# Patient Record
Sex: Male | Born: 1970 | Race: White | Hispanic: No | Marital: Single | Smoking: Current every day smoker
Health system: Southern US, Community
[De-identification: ages and names within clinical notes are randomized; demographics above are authoritative.]

## PROBLEM LIST (undated history)

## (undated) HISTORY — PX: HERNIA REPAIR: SHX51

---

## 2013-11-30 ENCOUNTER — Ambulatory Visit (INDEPENDENT_AMBULATORY_CARE_PROVIDER_SITE_OTHER): Payer: 59 | Admitting: Emergency Medicine

## 2013-11-30 ENCOUNTER — Ambulatory Visit (INDEPENDENT_AMBULATORY_CARE_PROVIDER_SITE_OTHER): Payer: 59

## 2013-11-30 VITALS — BP 120/90 | HR 66 | Temp 98.1°F | Resp 16 | Ht 74.0 in | Wt 223.4 lb

## 2013-11-30 DIAGNOSIS — S6991XA Unspecified injury of right wrist, hand and finger(s), initial encounter: Secondary | ICD-10-CM

## 2013-11-30 MED ORDER — NAPROXEN SODIUM 550 MG PO TABS: 550.0000 mg | ORAL_TABLET | Freq: Two times a day (BID) | ORAL | Status: AC

## 2013-11-30 NOTE — Progress Notes (Signed)
[facility] [address], [place] [id number] [phone]  Date:  11/30/2013   Name:  Patrick Deleon   DOB:  01/16/1970   MRN:  [id number]  PCP:  No primary care provider on file.    Chief Complaint: Hand Injury   History of Present Illness:  Patrick Deleon is a 43 y.o. very pleasant male patient who presents with the following:  Injured yesterday and fell against a door frame.  Has inability to freely use the rigt third or second finger. No improvement with over the counter medications or other home remedies.  Denies other complaint or health concern today.   There are no active problems to display for this patient.   History reviewed. No pertinent past medical history.  Past Surgical History  Procedure Laterality Date  . Hernia repair      History  Substance Use Topics  . Smoking status: Never Smoker   . Smokeless tobacco: Not on file  . Alcohol Use: Not on file    Family History  Problem Relation Age of Onset  . Cancer Mother   . Cancer Maternal Grandmother     Not on File  Medication list has been reviewed and updated.  No current outpatient prescriptions on file prior to visit.   No current facility-administered medications on file prior to visit.    Review of Systems:  As per HPI, otherwise negative.    Physical Examination: Filed Vitals:   11/30/13 1359  BP: 120/90  Pulse: 66  Temp: 98.1 F (36.7 C)  Resp: 16   Filed Vitals:   11/30/13 1359  Height: 6\' 2"  (1.88 m)  Weight: 223 lb 6.4 oz (101.334 kg)   Body mass index is 28.67 kg/(m^2). Ideal Body Weight: Weight in (lb) to have BMI = 25: 194.3   GEN: WDWN, NAD, Non-toxic, Alert & Oriented x 3 HEENT: Atraumatic, Normocephalic.  Ears and Nose: No external deformity. EXTR: No clubbing/cyanosis/edema NEURO: Normal gait.  PSYCH: Normally interactive. Conversant. Not depressed or anxious appearing.  Calm demeanor.    Assessment and Plan: Contusion  hand Splint RICE Anaprox   Signed,  Phillips OdorJeffery Anderson, MD   [facility] reading (PRIMARY) by  Dr. Dareen PianoAnderson  negative.

## 2013-11-30 NOTE — Patient Instructions (Signed)
Hand Contusion °A hand contusion is a deep bruise on your hand area. Contusions are the result of an injury that caused bleeding under the skin. The contusion may turn blue, purple, or yellow. Minor injuries will give you a painless contusion, but more severe contusions may stay painful and swollen for a few weeks. °CAUSES  °A contusion is usually caused by a blow, trauma, or direct force to an area of the body. °SYMPTOMS  °· Swelling and redness of the injured area. °· Discoloration of the injured area. °· Tenderness and soreness of the injured area. °· Pain. °DIAGNOSIS  °The diagnosis can be made by taking a history and performing a physical exam. An X-ray, CT scan, or MRI may be needed to determine if there were any associated injuries, such as broken bones (fractures). °TREATMENT  °Often, the best treatment for a hand contusion is resting, elevating, icing, and applying cold compresses to the injured area. Over-the-counter medicines may also be recommended for pain control. °HOME CARE INSTRUCTIONS  °· Put ice on the injured area. °¨ Put ice in a plastic bag. °¨ Place a towel between your skin and the bag. °¨ Leave the ice on for 15-20 minutes, 03-04 times a day. °· Only take over-the-counter or prescription medicines as directed by your caregiver. Your caregiver may recommend avoiding anti-inflammatory medicines (aspirin, ibuprofen, and naproxen) for 48 hours because these medicines may increase bruising. °· If told, use an elastic wrap as directed. This can help reduce swelling. You may remove the wrap for sleeping, showering, and bathing. If your fingers become numb, cold, or blue, take the wrap off and reapply it more loosely. °· Elevate your hand with pillows to reduce swelling. °· Avoid overusing your hand if it is painful. °SEEK IMMEDIATE MEDICAL CARE IF:  °· You have increased redness, swelling, or pain in your hand. °· Your swelling or pain is not relieved with medicines. °· You have loss of feeling in  your hand or are unable to move your fingers. °· Your hand turns cold or blue. °· You have pain when you move your fingers. °· Your hand becomes warm to the touch. °· Your contusion does not improve in 2 days. °MAKE SURE YOU:  °· Understand these instructions. °· Will watch your condition. °· Will get help right away if you are not doing well or get worse. °Document Released: 06/23/2001 Document Revised: 09/26/2011 Document Reviewed: 06/25/2011 °ExitCare® Patient Information ©2015 [employer]. This information is not intended to replace advice given to you by your health care provider. Make sure you discuss any questions you have with your health care provider. ° °

## 2015-12-30 IMAGING — CR DG HAND COMPLETE 3+V*R*
3 series · 3 of 3 positions shown · non-contrast
Comparison: None.

CLINICAL DATA: 42-year-old male with hand injury during fall with
pain. Initial encounter.

EXAM:
RIGHT HAND - COMPLETE 3+ VIEW

[PA]
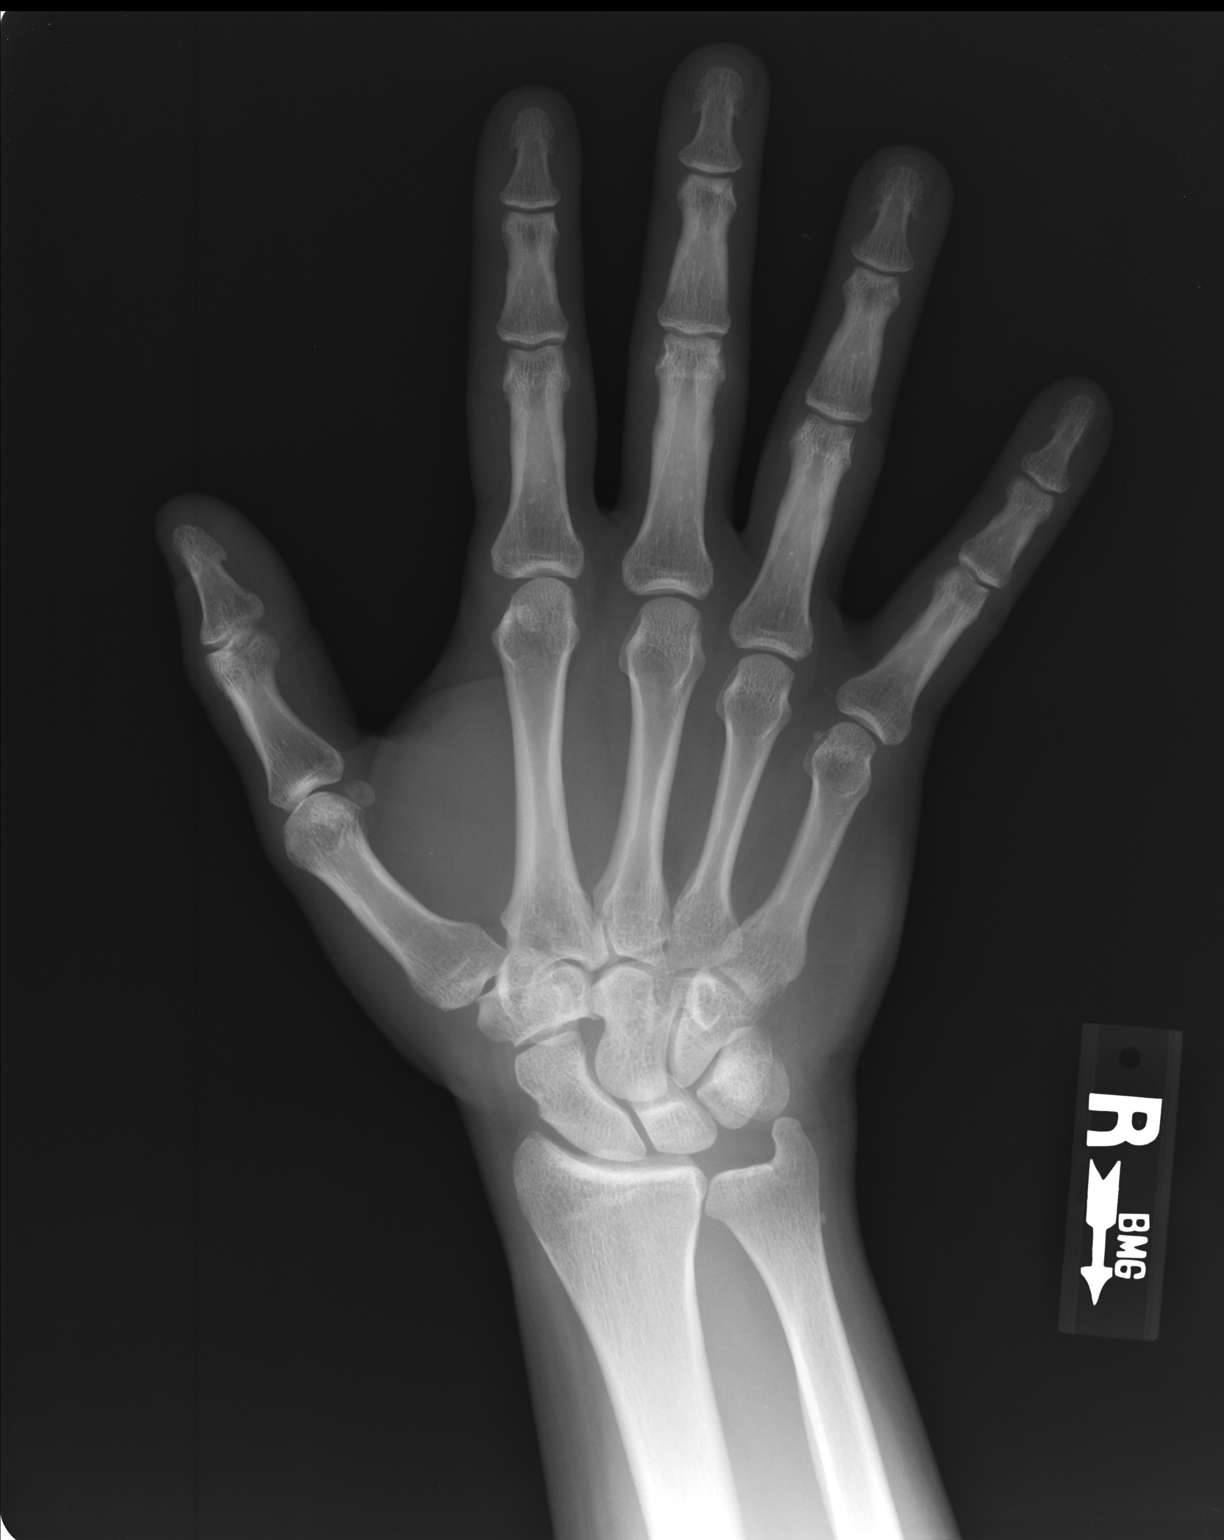

[pa obl]
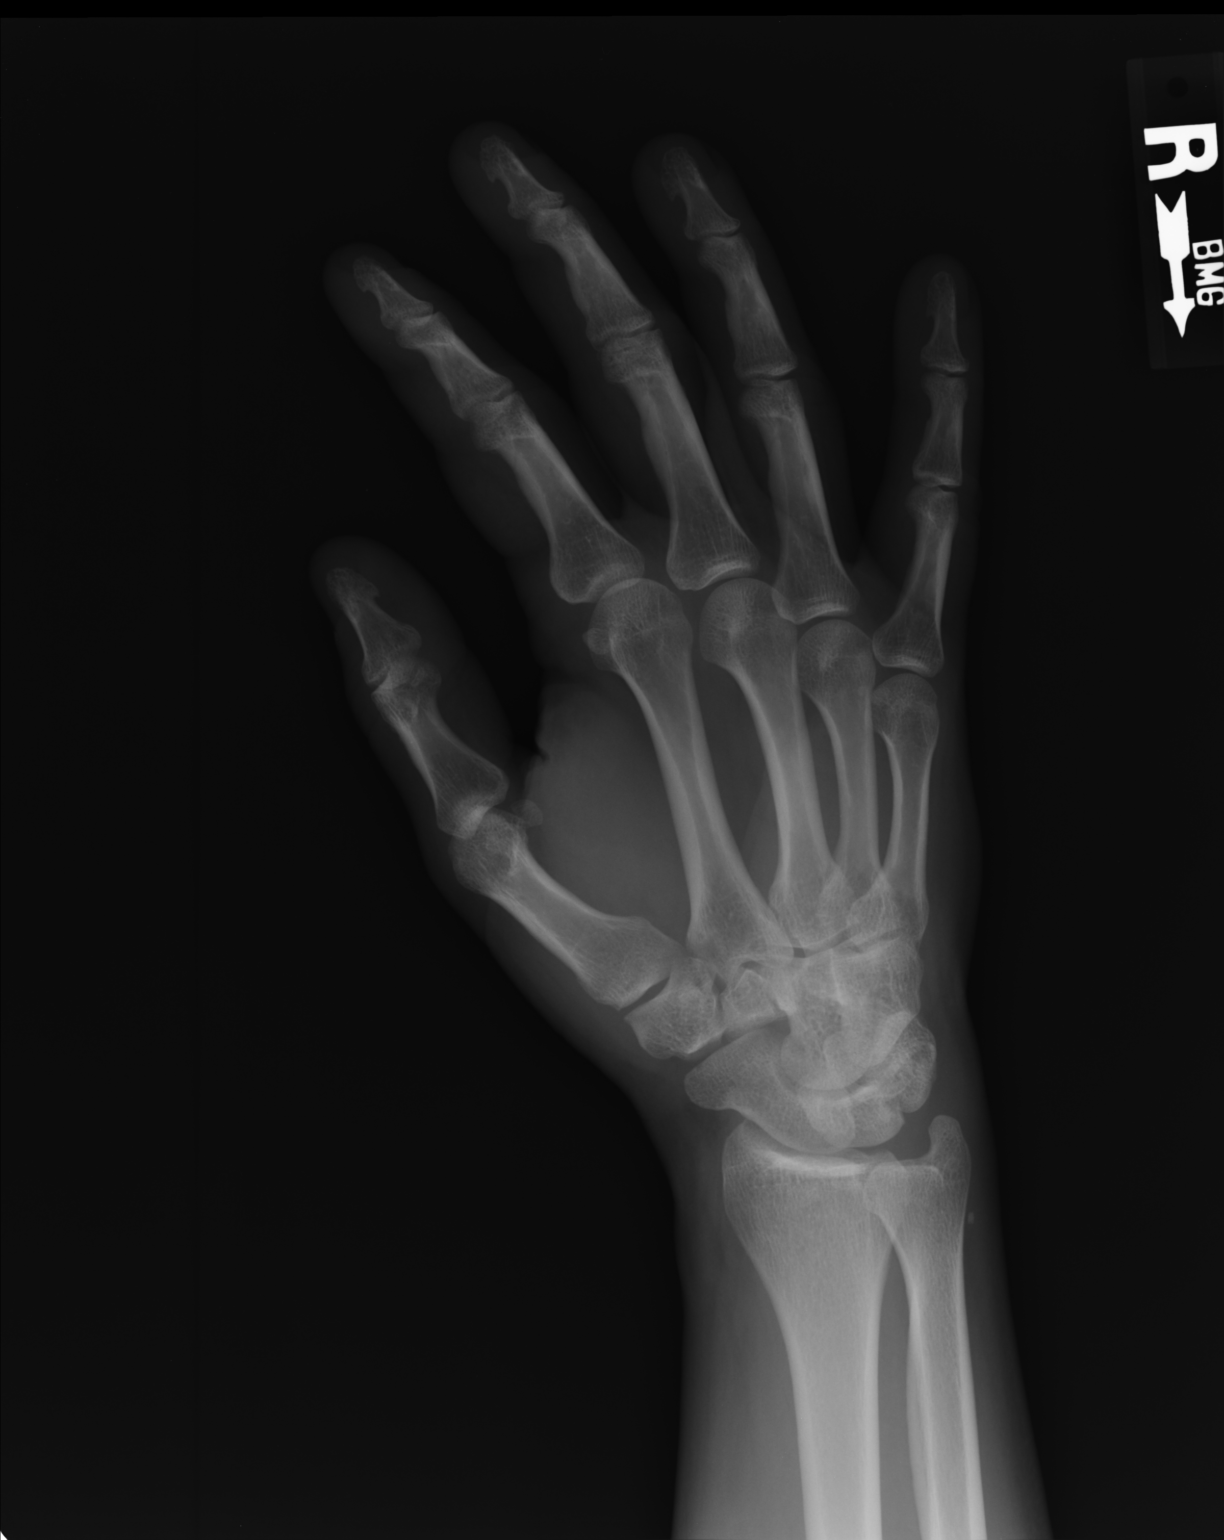

[lateral]
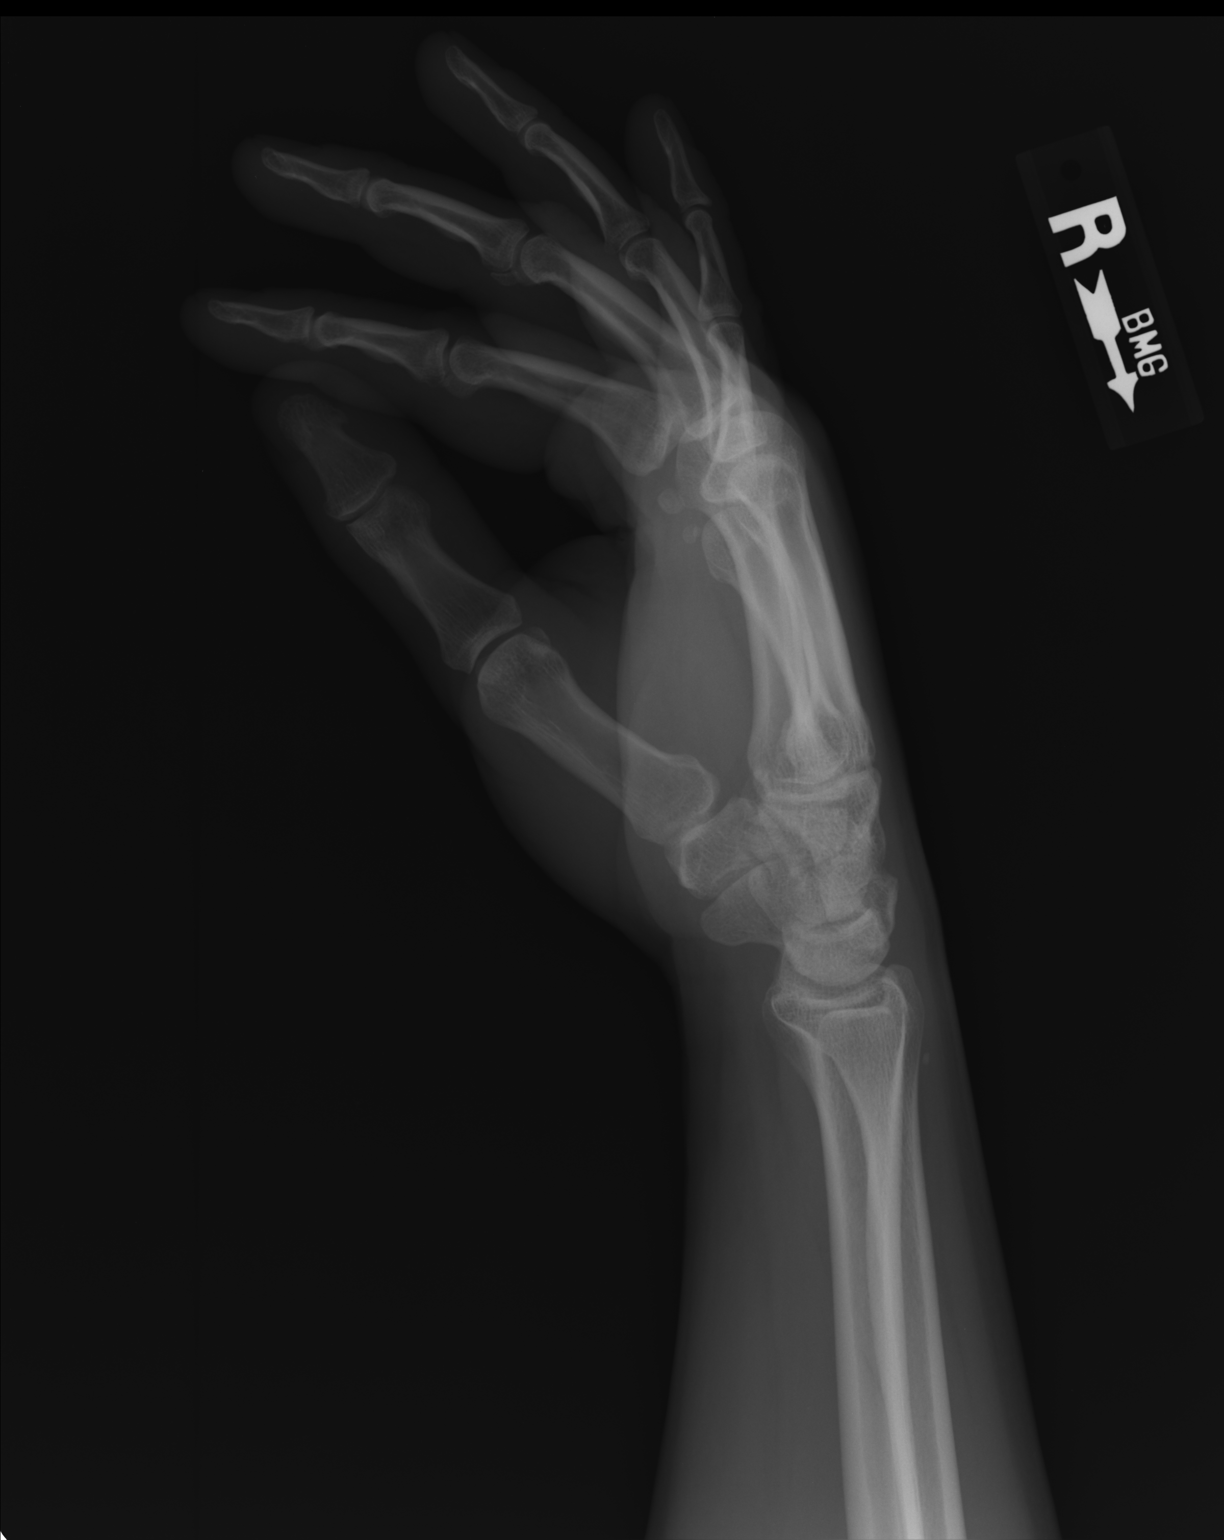

[3 of 3 positions shown; findings below may reference images not displayed]

FINDINGS: Bone mineralization is within normal limits. Distal radius and ulna
appear intact. There is a tiny calcific focus adjacent to the dorsal
distal metaphysis of the ulna, appears inconsequential. Carpal bone
alignment within normal limits. Metacarpals intact. Phalanges
intact.
IMPRESSION: No acute fracture or dislocation identified about the right hand.

## 2016-09-13 ENCOUNTER — Ambulatory Visit (INDEPENDENT_AMBULATORY_CARE_PROVIDER_SITE_OTHER): Payer: BLUE CROSS/BLUE SHIELD | Admitting: Family

## 2016-09-13 ENCOUNTER — Encounter (INDEPENDENT_AMBULATORY_CARE_PROVIDER_SITE_OTHER): Payer: Self-pay | Admitting: Family

## 2016-09-13 ENCOUNTER — Ambulatory Visit (INDEPENDENT_AMBULATORY_CARE_PROVIDER_SITE_OTHER): Payer: Self-pay

## 2016-09-13 DIAGNOSIS — M25562 Pain in left knee: Secondary | ICD-10-CM | POA: Diagnosis not present

## 2016-09-13 MED ORDER — METHYLPREDNISOLONE ACETATE 40 MG/ML IJ SUSP
40.0000 mg | INTRAMUSCULAR | Status: AC | PRN
  Administered 2016-09-13: 40 mg via INTRA_ARTICULAR

## 2016-09-13 MED ORDER — LIDOCAINE HCL 1 % IJ SOLN
5.0000 mL | INTRAMUSCULAR | Status: AC | PRN
  Administered 2016-09-13: 5 mL

## 2016-09-13 NOTE — Progress Notes (Signed)
Office Visit Note   Patient: Patrick Deleon           Date of Birth: 1970-05-07           MRN: [id number] Visit Date: 09/13/2016              Requested by: No referring provider defined for this encounter. PCP: No primary care provider on file.  Chief Complaint  Patient presents with  . Left Knee - Pain      HPI: The patient is a 46 year old gentleman who presents a complaining of left knee pain. States is typically actively tenderness. About a week ago he is playing basketball in his left knee began to bother him. He is unable to recall a specific injury or twisting motion but he is having sharp pain in the front and in the center of his knee he is tried rest and ice and ibuprofen without relief.  Has had IT band issues before states initially had lateral pain with this, is now medial, wondered if was related to IT band.  No pain in hip or thigh.  Assessment & Plan: Visit Diagnoses:  1. Left knee pain, unspecified chronicity     Plan: Depo medrol injection left knee today. Follow up in office in 4 weeks if continued issues.   Follow-Up Instructions: Return in about 4 weeks (around 10/11/2016), or if symptoms worsen or fail to improve.   Left Knee Exam   Tenderness  The patient is experiencing tenderness in the medial joint line and medial retinaculum.  Range of Motion  The patient has normal left knee ROM.  Muscle Strength   The patient has normal left knee strength.  Tests  Varus: negative Valgus: negative  Other  Erythema: absent Swelling: none Effusion: no effusion present      Patient is alert, oriented, no adenopathy, well-dressed, normal affect, normal respiratory effort.   Imaging: Xr Knee 1-2 Views Left  Result Date: 09/13/2016 Radiographs of the left knee are negative for fracture. Does have some calcified free bodies posterior to knee.  No images are attached to the encounter.  Labs: No results found for: HGBA1C, ESRSEDRATE, CRP,  LABURIC, REPTSTATUS, GRAMSTAIN, CULT, LABORGA  Orders:  Orders Placed This Encounter  Procedures  . XR Knee 1-2 Views Left   No orders of the defined types were placed in this encounter.    Procedures: Large Joint Inj Date/Time: 09/13/2016 10:47 AM Performed by: Adonis HugueninZAMORA, ERIN R Authorized by: Barnie DelZAMORA, ERIN R   Consent Given by:  Patient Site marked: the procedure site was marked   Timeout: prior to procedure the correct patient, procedure, and site was verified   Indications:  Pain and diagnostic evaluation Location:  Knee Site:  L knee Needle Size:  22 G Needle Length:  1.5 inches Ultrasound Guidance: No   Fluoroscopic Guidance: No   Arthrogram: No   Medications:  5 mL lidocaine 1 %; 40 mg methylPREDNISolone acetate 40 MG/ML Aspiration Attempted: No   Patient tolerance:  Patient tolerated the procedure well with no immediate complications    Clinical Data: No additional findings.  ROS:  All other systems negative, except as noted in the HPI. Review of Systems  Constitutional: Negative for chills and fever.  Musculoskeletal: Positive for arthralgias and joint swelling. Negative for gait problem.    Objective: Vital Signs: There were no vitals taken for this visit.  Specialty Comments:  No specialty comments available.  PMFS History: There are no active problems to display for this  patient.  No past medical history on file.  Family History  Problem Relation Age of Onset  . Cancer Mother   . Cancer Maternal Grandmother     Past Surgical History:  Procedure Laterality Date  . HERNIA REPAIR     Social History   Occupational History  . Not on file.   Social History Main Topics  . Smoking status: Never Smoker  . Smokeless tobacco: Never Used  . Alcohol use Not on file  . Drug use: Unknown  . Sexual activity: Not on file

## 2016-09-24 ENCOUNTER — Telehealth (INDEPENDENT_AMBULATORY_CARE_PROVIDER_SITE_OTHER): Payer: Self-pay | Admitting: Family

## 2016-09-24 NOTE — Telephone Encounter (Signed)
Patient called advised he is still having a look of popping and cracking in his knee. Patient asked if Denny Peonrin can give him a call concerning the issues he is having with his knee. The  Number to contact patient is [phone]

## 2016-10-18 ENCOUNTER — Ambulatory Visit (INDEPENDENT_AMBULATORY_CARE_PROVIDER_SITE_OTHER): Payer: BLUE CROSS/BLUE SHIELD | Admitting: Orthopedic Surgery

## 2016-10-18 ENCOUNTER — Encounter (INDEPENDENT_AMBULATORY_CARE_PROVIDER_SITE_OTHER): Payer: Self-pay | Admitting: Family

## 2016-10-18 DIAGNOSIS — M2242 Chondromalacia patellae, left knee: Secondary | ICD-10-CM | POA: Diagnosis not present

## 2016-10-18 NOTE — Progress Notes (Signed)
   Office Visit Note   Patient: Patrick Deleon           Date of Birth: 1970/03/11           MRN: [id number] Visit Date: 10/18/2016              Requested by: Verlon Au, MD [address] [place], [place] [address] PCP: Verlon Au, MD  Chief Complaint  Patient presents with  . Left Knee - Pain      HPI: Patient is a 45 shell gentleman who is complaining of pain in the patellofemoral joint lateral facet. Patient has pain with bicycling pain with activities of daily living. Pain worse when going downhill with eccentric contracture.  Assessment & Plan: Visit Diagnoses:  1. Chondromalacia patellae, left knee     Plan: Recommended close chain connect exercises recommended isometric straight leg raises with step ups and reverse lunges and these were demonstrated. Discussed the possibility of hyaluronic acid injection.  Follow-Up Instructions: Return if symptoms worsen or fail to improve.   Ortho Exam  Patient is alert, oriented, no adenopathy, well-dressed, normal affect, normal respiratory effort. Examination patient has a normal gait. Review his radiographs show some mild medial joint line narrowing. There is no subcondylar sclerosis or cysts. There are no bony spurs in the patellofemoral joint. Patient has no tenderness to palpation over the medial or lateral joint lines. Clausen cruciate are stable. Patient is primarily tender to palpation lateral facet of the patella. With range of motion of the knee there is crepitation in the patellofemoral joint.  Imaging: No results found. No images are attached to the encounter.  Labs: No results found for: HGBA1C, ESRSEDRATE, CRP, LABURIC, REPTSTATUS, GRAMSTAIN, CULT, LABORGA  Orders:  No orders of the defined types were placed in this encounter.  No orders of the defined types were placed in this encounter.    Procedures: No procedures performed  Clinical Data: No additional findings.  ROS:  All  other systems negative, except as noted in the HPI. Review of Systems  Objective: Vital Signs: There were no vitals taken for this visit.  Specialty Comments:  No specialty comments available.  PMFS History: Patient Active Problem List   Diagnosis Date Noted  . Chondromalacia patellae, left knee 10/18/2016   History reviewed. No pertinent past medical history.  Family History  Problem Relation Age of Onset  . Cancer Mother   . Cancer Maternal Grandmother     Past Surgical History:  Procedure Laterality Date  . HERNIA REPAIR     Social History   Occupational History  . Not on file.   Social History Main Topics  . Smoking status: Never Smoker  . Smokeless tobacco: Never Used  . Alcohol use Not on file  . Drug use: Unknown  . Sexual activity: Not on file

## 2022-10-14 ENCOUNTER — Emergency Department (HOSPITAL_COMMUNITY)
Admission: EM | Admit: 2022-10-14 | Discharge: 2022-10-16 | Disposition: A | Discharge status: Home / Self Care | Payer: Self-pay | Attending: Emergency Medicine | Admitting: Emergency Medicine

## 2022-10-14 ENCOUNTER — Encounter (HOSPITAL_COMMUNITY): Payer: Self-pay | Admitting: *Deleted

## 2022-10-14 ENCOUNTER — Other Ambulatory Visit: Payer: Self-pay

## 2022-10-14 DIAGNOSIS — F1410 Cocaine abuse, uncomplicated: Secondary | ICD-10-CM | POA: Insufficient documentation

## 2022-10-14 DIAGNOSIS — F309 Manic episode, unspecified: Secondary | ICD-10-CM | POA: Insufficient documentation

## 2022-10-14 DIAGNOSIS — F1494 Cocaine use, unspecified with cocaine-induced mood disorder: Secondary | ICD-10-CM | POA: Diagnosis present

## 2022-10-14 DIAGNOSIS — F22 Delusional disorders: Secondary | ICD-10-CM | POA: Diagnosis present

## 2022-10-14 DIAGNOSIS — F411 Generalized anxiety disorder: Secondary | ICD-10-CM | POA: Diagnosis present

## 2022-10-14 LAB — COMPREHENSIVE METABOLIC PANEL
ALT: 30 U/L (ref 0–44)
AST: 25 U/L (ref 15–41)
Albumin: 4.4 g/dL (ref 3.5–5.0)
Alkaline Phosphatase: 69 U/L (ref 38–126)
Anion gap: 11 (ref 5–15)
BUN: 13 mg/dL (ref 6–20)
CO2: 21 mmol/L — ABNORMAL LOW (ref 22–32)
Calcium: 9.3 mg/dL (ref 8.9–10.3)
Chloride: 107 mmol/L (ref 98–111)
Creatinine, Ser: 0.93 mg/dL (ref 0.61–1.24)
GFR, Estimated: >60 mL/min (ref >60)
Glucose, Bld: 96 mg/dL (ref 70–99)
Potassium: 3.7 mmol/L (ref 3.5–5.1)
Sodium: 139 mmol/L (ref 135–145)
Total Bilirubin: 1.5 mg/dL — ABNORMAL HIGH (ref 0.3–1.2)
Total Protein: 7.9 g/dL (ref 6.5–8.1)

## 2022-10-14 LAB — RAPID URINE DRUG SCREEN, HOSP PERFORMED
Amphetamines: NOT DETECTED
Barbiturates: NOT DETECTED
Benzodiazepines: NOT DETECTED
Cocaine: POSITIVE — AB
Opiates: NOT DETECTED
Tetrahydrocannabinol: NOT DETECTED

## 2022-10-14 LAB — ETHANOL: Alcohol, Ethyl (B): <10 mg/dL (ref <10)

## 2022-10-14 LAB — CBC
HCT: 45 % (ref 39.0–52.0)
Hemoglobin: 14.7 g/dL (ref 13.0–17.0)
MCH: 30.2 pg (ref 26.0–34.0)
MCHC: 32.7 g/dL (ref 30.0–36.0)
MCV: 92.6 fL (ref 80.0–100.0)
Platelets: 301 10*3/uL (ref 150–400)
RBC: 4.86 MIL/uL (ref 4.22–5.81)
RDW: 12.9 % (ref 11.5–15.5)
WBC: 12.1 10*3/uL — ABNORMAL HIGH (ref 4.0–10.5)
nRBC: 0 % (ref 0.0–0.2)

## 2022-10-14 LAB — ACETAMINOPHEN LEVEL: Acetaminophen (Tylenol), Serum: <10 ug/mL — ABNORMAL LOW (ref 10–30)

## 2022-10-14 LAB — SALICYLATE LEVEL: Salicylate Lvl: <7 mg/dL — ABNORMAL LOW (ref 7.0–30.0)

## 2022-10-14 MED ORDER — LORAZEPAM 1 MG PO TABS
1.0000 mg | ORAL_TABLET | Freq: Three times a day (TID) | ORAL | Status: DC | PRN
  Filled 2022-10-14: qty 1

## 2022-10-14 NOTE — Consult Note (Cosign Needed Addendum)
[facility] ED ASSESSMENT   Reason for Consult:  Psychiatry evaluation Referring Physician:  ER Physician Patient Identification: Patrick Deleon MRN:  [id number] ED Chief Complaint: Generalized anxiety disorder  Diagnosis:  Principal Problem:   Generalized anxiety disorder Active Problems:   Cocaine-induced mood disorder [facility])   ED Assessment Time Calculation: Start Time: 1918 Stop Time: 1941 Total Time in Minutes (Assessment Completion): 23   Subjective:   Patrick Deleon is a 52 y.o. male patient admitted with previous hx of Anxiety back in 2018 .came in to the ER voluntarily appeared to be Manic at the time.  He was accompanied by [employer] called by his wife.  Patient was very anxious and manic on arrival that made him keep talking non stop  He was tearful asking staff to call his wife to find out what he can do to fix their marriage.  HPI:  This is third attempt to evaluate this patient and he agreed to speak with provider meaningfully because he heard that his wife is coming to see him.  Patient reports strong hx of Mental illness in the family but denies ever seeing any Psychiatrist.  He says he only had anxiety related to a stressful job back in 2018.  He has never been on Medications he said and attempted therapy briefly because he realized his family do need help.  Patient admitted that he and his wife have had issues in their marriage but he does not want the marriage to end.  Patient reports that his brother suffers from Bipolar disorder and does not take his medications.  He does not have nothing to do with that brother.  His mother suffered from Depression and anxiety,  but died from Cancer.  Patient is unemployed at this time.  He referred further question to his wife .  Wife has not answered any phone call to her since this afternoon.  Past Psychiatric History: Anxiety  Risk to Self or Others: Is the patient at risk to self? No Has the patient been a risk to self in the past 6 months? No Has  the patient been a risk to self within the distant past? No Is the patient a risk to others? No Has the patient been a risk to others in the past 6 months? No Has the patient been a risk to others within the distant past? No  [place] Scale:  Flowsheet Row ED from 10/14/2022 in [facility] Emergency Department at [facility]  C-SSRS RISK CATEGORY No Risk       AIMS:  , , ,  ,   ASAM:    Substance Abuse:     Past Medical History: No past medical history on file.  Past Surgical History:  Procedure Laterality Date   HERNIA REPAIR     Family History:  Family History  Problem Relation Age of Onset   Cancer Mother    Cancer Maternal Grandmother    Family Psychiatric  History: Mother- Depression and anxiety, Brother - Bipolar disorder, Paternal grandmother -bipolar disorder, His son ADHD Social History:  Social History   Substance and Sexual Activity  Alcohol Use Not Currently     Social History   Substance and Sexual Activity  Drug Use Not Currently    Social History   Socioeconomic History   Marital status: Single    Spouse name: Not on file   Number of children: Not on file   Years of education: Not on file   Highest education level: Not on  file  Occupational History   Not on file  Tobacco Use   Smoking status: Every Day    Types: Cigarettes   Smokeless tobacco: Never  Substance and Sexual Activity   Alcohol use: Not Currently   Drug use: Not Currently   Sexual activity: Not on file  Other Topics Concern   Not on file  Social History Narrative   Not on file   Social Determinants of Health   Financial Resource Strain: Not on file  Food Insecurity: Not on file  Transportation Needs: Not on file  Physical Activity: Not on file  Stress: Not on file  Social Connections: Not on file   Additional Social History:    Allergies:  No Known Allergies  Labs:  Results for orders placed or performed during the hospital encounter of 10/14/22 (from  the past 48 hour(s))  Comprehensive metabolic panel     Status: Abnormal   Collection Time: 10/14/22 12:23 PM  Result Value Ref Range   Sodium 139 135 - 145 mmol/L   Potassium 3.7 3.5 - 5.1 mmol/L   Chloride 107 98 - 111 mmol/L   CO2 21 (L) 22 - 32 mmol/L   Glucose, Bld 96 70 - 99 mg/dL    Comment: Glucose reference range applies only to samples taken after fasting for at least 8 hours.   BUN 13 6 - 20 mg/dL   Creatinine, Ser 1.61 0.61 - 1.24 mg/dL   Calcium 9.3 8.9 - 09.6 mg/dL   Total Protein 7.9 6.5 - 8.1 g/dL   Albumin 4.4 3.5 - 5.0 g/dL   AST 25 15 - 41 U/L   ALT 30 0 - 44 U/L   Alkaline Phosphatase 69 38 - 126 U/L   Total Bilirubin 1.5 (H) 0.3 - 1.2 mg/dL   GFR, Estimated >04 >54 mL/min    Comment: (NOTE) Calculated using the CKD-EPI Creatinine Equation (2021)    Anion gap 11 5 - 15    Comment: Performed at [facility], [address] [place], [place] [address]  Ethanol     Status: None   Collection Time: 10/14/22 12:23 PM  Result Value Ref Range   Alcohol, Ethyl (B) <10 <10 mg/dL    Comment: (NOTE) Lowest detectable limit for serum alcohol is 10 mg/dL.  For medical purposes only. Performed at [facility], [address] [place], [place] [address]   Salicylate level     Status: Abnormal   Collection Time: 10/14/22 12:23 PM  Result Value Ref Range   Salicylate Lvl <7.0 (L) 7.0 - 30.0 mg/dL    Comment: Performed at [facility], [address] [place], [place] [address]  Acetaminophen level     Status: Abnormal   Collection Time: 10/14/22 12:23 PM  Result Value Ref Range   Acetaminophen (Tylenol), Serum <10 (L) 10 - 30 ug/mL    Comment: (NOTE) Therapeutic concentrations vary significantly. A range of 10-30 ug/mL  may be an effective concentration for many patients. However, some  are best treated at concentrations outside of this range. Acetaminophen concentrations >150 ug/mL at 4 hours after ingestion   and >50 ug/mL at 12 hours after ingestion are often associated with  toxic reactions.  Performed at [facility], [address], [place], [place] [address]   cbc     Status: Abnormal   Collection Time: 10/14/22 12:23 PM  Result Value Ref Range   WBC 12.1 (H) 4.0 - 10.5 K/uL  RBC 4.86 4.22 - 5.81 MIL/uL   Hemoglobin 14.7 13.0 - 17.0 g/dL   HCT 16.1 09.6 - 04.5 %   MCV 92.6 80.0 - 100.0 fL   MCH 30.2 26.0 - 34.0 pg   MCHC 32.7 30.0 - 36.0 g/dL   RDW 40.9 81.1 - 91.4 %   Platelets 301 150 - 400 K/uL   nRBC 0.0 0.0 - 0.2 %    Comment: Performed at [facility], [address] [place], [place] [address]  Rapid urine drug screen (hospital performed)     Status: Abnormal   Collection Time: 10/14/22 12:23 PM  Result Value Ref Range   Opiates NONE DETECTED NONE DETECTED   Cocaine POSITIVE (A) NONE DETECTED   Benzodiazepines NONE DETECTED NONE DETECTED   Amphetamines NONE DETECTED NONE DETECTED   Tetrahydrocannabinol NONE DETECTED NONE DETECTED   Barbiturates NONE DETECTED NONE DETECTED    Comment: (NOTE) DRUG SCREEN FOR MEDICAL PURPOSES ONLY.  IF CONFIRMATION IS NEEDED FOR ANY PURPOSE, NOTIFY LAB WITHIN 5 DAYS.  LOWEST DETECTABLE LIMITS FOR URINE DRUG SCREEN Drug Class                     Cutoff (ng/mL) Amphetamine and metabolites    1000 Barbiturate and metabolites    200 Benzodiazepine                 200 Opiates and metabolites        300 Cocaine and metabolites        300 THC                            50 Performed at [facility], [address], [place], [place] [address]     Current Facility-Administered Medications  Medication Dose Route Frequency Provider Last Rate Last Admin   LORazepam (ATIVAN) tablet 1 mg  1 mg Oral Q8H PRN Earney Navy, NP       Current Outpatient Medications  Medication Sig Dispense Refill   ibuprofen (ADVIL,MOTRIN) 200 MG tablet Take 200 mg by mouth every 6 (six) hours as  needed.      Musculoskeletal: Strength & Muscle Tone: within normal limits Gait & Station: normal Patient leans: Front   Psychiatric Specialty Exam: Presentation  General Appearance:  Casual; Neat  Eye Contact: Good  Speech: Clear and Coherent; Pressured  Speech Volume: Normal  Handedness: Right   Mood and Affect  Mood: Anxious; Hopeless  Affect: Congruent; Tearful   Thought Process  Thought Processes: Coherent  Descriptions of Associations:Intact  Orientation:Full (Time, Place and Person)  Thought Content:Logical  History of Schizophrenia/Schizoaffective disorder:No data recorded Duration of Psychotic Symptoms:No data recorded Hallucinations:Hallucinations: None  Ideas of Reference:None  Suicidal Thoughts:Suicidal Thoughts: No  Homicidal Thoughts:Homicidal Thoughts: No   Sensorium  Memory: Immediate Good; Recent Good; Remote Poor  Judgment: Impaired  Insight: Lacking   Executive Functions  Concentration: Poor  Attention Span: Poor  Recall: Poor  Fund of Knowledge: Poor  Language: Good   Psychomotor Activity  Psychomotor Activity: Psychomotor Activity: Restlessness; Increased   Assets  Assets: Communication Skills; Desire for Improvement; Housing; Intimacy; Physical Health    Sleep  Sleep: Sleep: Fair   Physical Exam: Physical Exam Vitals and nursing note reviewed.  Constitutional:      Appearance: Normal appearance.  HENT:     Head: Normocephalic and atraumatic.     Nose: Nose normal.  Cardiovascular:  Rate and Rhythm: Normal rate and regular rhythm.  Pulmonary:     Effort: Pulmonary effort is normal.  Musculoskeletal:        General: Normal range of motion.     Cervical back: Normal range of motion.  Skin:    General: Skin is dry.  Neurological:     Mental Status: He is alert and oriented to person, place, and time.  Psychiatric:        Attention and Perception: He is inattentive.        Mood  and Affect: Mood is anxious. Affect is angry and tearful.        Speech: Speech is rapid and pressured and tangential.        Behavior: Behavior is cooperative.        Thought Content: Thought content normal.        Judgment: Judgment is inappropriate.    Review of Systems  Constitutional: Negative.   HENT: Negative.    Eyes: Negative.   Respiratory: Negative.    Cardiovascular: Negative.   Gastrointestinal: Negative.   Genitourinary: Negative.   Musculoskeletal: Negative.   Skin: Negative.   Neurological: Negative.   Endo/Heme/Allergies: Negative.   Psychiatric/Behavioral:  The patient is nervous/anxious.    Blood pressure (!) 139/104, pulse 88, temperature 98 F (36.7 C), temperature source Oral, resp. rate 18, height 6' 2.75" (1.899 m), weight 98.9 kg, SpO2 98%. Body mass index is 27.43 kg/m.  Medical Decision Making: Patient denies SI/HI/AVH.  He is not seeing a Therapist, sports.  He is not forthcoming with much information and refers provider to his wife.  Patient will be monitored over night and will be reevaluated in am. Problem 1: Anxiety Disorder  Problem 2: R/O Bipolar disorder, Manic  Problem 3: Cocaine Induced Mood Disorder  Disposition:  Monitor Overnight and reevaluate in am.  Earney Navy, NP-PMHNP-BC 10/14/2022 7:41 PM

## 2022-10-14 NOTE — ED Notes (Signed)
Attempted to given pt ativan. Pt refused saying "I don't need any drugs. Nothing is wrong with me. I just need to talk". Report given to SAPU. Pt has 1 belonging bag.

## 2022-10-14 NOTE — ED Notes (Addendum)
Patient at this time is calm and resting in his room.

## 2022-10-14 NOTE — ED Notes (Addendum)
Spoke with patient's wife Marcelino Duster in the day room after visiting with her husband. Obtained history from her on patients past behavioral issues and the ongoing situation at home. Wife stated that he has not been officially diagnosed with anything and has been sometime since he has spoken with a psychiatrist. However, his grandfather and half-brother both have history of Bipolar manic disorder. Wife stated that this all started when he lost his job back in March. Ever since then he has slowly gone downhill in regards to his social life with pushing friends away with his behaviors. Patient has been fixated on the fact the wife is the issue and along with that there is paranoia that she has been having an affair. The patient's paranoia leads him to placing mini microphones in the wife's car and tells her that he has a Photographer going to send him the recordings from the device. Patient has made it known he knows every dent, scratch, marking, and fingerprints on the wife's car. The wife states at least once a month he goes through these manic phases and will be awake for 3-4 day stretches. States that the paranoia has been going on for 2 years almost and when in the manic states he gets worse with the paranoia.Wife did state when asked that the patient never has gotten physical when in these states. Patient currently is on day 3 of no sleep. What got him into the ER today she said was when she went out to the garage the patient was talking to his self and he turned around and looked at her then proceeded to turn back to the lawn mower and started talking again but no one was there. Wife then called the non-emergency line and had cops come out there and was able to get the patient to come voluntary to get checked out. Patients wife also expressed concerns for patient's drug use. She stated that he admitted to the cops that he has done cocaine. She also believes that he has done meth as well. She is worried  about being home with him in these states because they have an autistic son and the husbands father lives with them. She doesn't want her son seeing him like this and worrying him with his father's behavior. She states she is financially not able to find a new place to live and that she doesn't know how to help the patient anymore. Wife stated that at this time she doesn't want to visit anymore because its hard for her to see him in this state. This Clinical research associate gave the wife for the direct phone line to the nurses' desk back here. Advised her that the NP might want to talk to her to get more history and wife stated that was fine.

## 2022-10-14 NOTE — ED Provider Notes (Signed)
[place] EMERGENCY DEPARTMENT AT [facility] Provider Note   CSN: [id number] Arrival date & time: 10/14/22  1054     History  Chief Complaint  Patient presents with   Medical Clearance    Patrick Deleon is a 52 y.o. male.  52 year old male with prior medical history as detailed below presents for evaluation.  Patient is exhibiting pressured speech, flight of ideas.  Patient apparently arrives voluntarily from home.  It appears that his wife may have called [employer] for transport.  Patient reports that he feels like he is disconnected from his wife.  He feels like their marriage is in crisis.  He is requesting marital counseling.  He wants someone to listen to him so that he can make better decisions to take care of his wife.  He admits to some alcohol use over the last several days.  He also admits to cocaine use sometime last night or early this morning.  He denies current active suicidal ideation.  He denies homicidal ideation.  He denies prior psychiatric or mental health disorder.  Patient offered medication to help him relax and calm down.  He declined same.  He prefers counseling services at this time.  The history is provided by the patient and medical records.       Home Medications Prior to Admission medications   Medication Sig Start Date End Date Taking? Authorizing Provider  ibuprofen (ADVIL,MOTRIN) 200 MG tablet Take 200 mg by mouth every 6 (six) hours as needed.    [provider]      Allergies    Patient has no known allergies.    Review of Systems   Review of Systems  All other systems reviewed and are negative.   Physical Exam Updated Vital Signs BP (!) 144/98 (BP Location: Left Arm)   Pulse (!) 106   Temp 98.6 F (37 C) (Oral)   Resp 18   Ht 6' 2.75" (1.899 m)   Wt 98.9 kg   SpO2 97%   BMI 27.43 kg/m  Physical Exam Vitals and nursing note reviewed.  Constitutional:      General: He is not in acute distress.    Appearance:  Normal appearance. He is well-developed.  HENT:     Head: Normocephalic and atraumatic.  Eyes:     Conjunctiva/sclera: Conjunctivae normal.     Pupils: Pupils are equal, round, and reactive to light.  Cardiovascular:     Rate and Rhythm: Normal rate and regular rhythm.     Heart sounds: Normal heart sounds.  Pulmonary:     Effort: Pulmonary effort is normal. No respiratory distress.     Breath sounds: Normal breath sounds.  Abdominal:     General: There is no distension.     Palpations: Abdomen is soft.     Tenderness: There is no abdominal tenderness.  Musculoskeletal:        General: No deformity. Normal range of motion.     Cervical back: Normal range of motion and neck supple.  Skin:    General: Skin is warm and dry.  Neurological:     General: No focal deficit present.     Mental Status: He is alert and oriented to person, place, and time.  Psychiatric:     Comments: Alert, mildly agitated, pressured speech, flight of ideas, admits to cocaine and use sometime in the last 12 hours     ED Results / Procedures / Treatments   Labs (all labs ordered are listed, but  only abnormal results are displayed) Labs Reviewed  COMPREHENSIVE METABOLIC PANEL - Abnormal; Notable for the following components:      Result Value   CO2 21 (*)    Total Bilirubin 1.5 (*)    All other components within normal limits  SALICYLATE LEVEL - Abnormal; Notable for the following components:   Salicylate Lvl <7.0 (*)    All other components within normal limits  ACETAMINOPHEN LEVEL - Abnormal; Notable for the following components:   Acetaminophen (Tylenol), Serum <10 (*)    All other components within normal limits  CBC - Abnormal; Notable for the following components:   WBC 12.1 (*)    All other components within normal limits  RAPID URINE DRUG SCREEN, HOSP PERFORMED - Abnormal; Notable for the following components:   Cocaine POSITIVE (*)    All other components within normal limits  ETHANOL     EKG None  Radiology No results found.  Procedures Procedures    Medications Ordered in ED Medications - No data to display  ED Course/ Medical Decision Making/ A&P                                 Medical Decision Making Amount and/or Complexity of Data Reviewed Labs: ordered.    Medical Screen Complete  This patient presented to the ED with complaint of mania.  This complaint involves an extensive number of treatment options. The initial differential diagnosis includes, but is not limited to, cocaine abuse, metabolic abnormality, mania  This presentation is: Acute, Self-Limited, Previously Undiagnosed, Uncertain Prognosis, Complicated, Systemic Symptoms, and Threat to Life/Bodily Function  Patient presents voluntarily for evaluation.  Patient admits to cocaine use sometime in the last 12 hours.    Patient with symptoms of mania including flight of ideas, pressured speech, agitation.  Patient is requesting psychiatric counseling and evaluation.  Patient is medically clear at this time for psychiatric evaluation.   Disposition dependent upon psychiatric evaluation and plan of care.    Additional history obtained:  External records from outside sources obtained and reviewed including prior ED visits and prior Inpatient records.    Lab Tests:  I ordered and personally interpreted labs.  The pertinent results include: CBC, urine tox screen, CMP, EtOH, acetaminophen, salicylate levels  Problem List / ED Course:  Mania, cocaine abuse   Reevaluation:  After the interventions noted above, I reevaluated the patient and found that they have: Stayed the same   Disposition:  After consideration of the diagnostic results and the patients response to treatment, I feel that the patent would benefit from psychiatric evaluation.          Final Clinical Impression(s) / ED Diagnoses Final diagnoses:  Mania (HCC)  Cocaine abuse (HCC)    Rx / DC  Orders ED Discharge Orders     None         Wynetta Fines, MD 10/14/22 1545

## 2022-10-14 NOTE — ED Triage Notes (Signed)
Pt brought in vol by [employer] in [place] stated, will not stop talking, feels wife has problems not him. Difficult to understand why he is here. Denies SI/HI

## 2022-10-14 NOTE — ED Notes (Signed)
Pt belongings moved to locker 41

## 2022-10-14 NOTE — ED Notes (Signed)
Pts spouse Marcelino Duster [phone] would like updates and to know if she is allowed to visit.

## 2022-10-14 NOTE — Consult Note (Signed)
Attempt to evaluate patient failed.  He is tearful with pressured speech.,  He is confused, disorganized and unable to out his thoughts together.  Patient frequently said" talk to my wife, she knows why she did this.  My marriage is gone as I am here"  Patient admitted to using Cocaine yesterday and UDS is positive form Cocaine.  Calls to wife is not answered.  Patient appears to not been seen or evaluated by any Mental health Provider as no previous record can be seen. Provider will try again later.

## 2022-10-15 DIAGNOSIS — F22 Delusional disorders: Secondary | ICD-10-CM | POA: Diagnosis present

## 2022-10-15 MED ORDER — ESCITALOPRAM OXALATE 10 MG PO TABS
5.0000 mg | ORAL_TABLET | Freq: Every day | ORAL | Status: DC
  Administered 2022-10-15: 5 mg via ORAL
  Filled 2022-10-15: qty 1

## 2022-10-15 MED ORDER — QUETIAPINE FUMARATE 50 MG PO TABS
50.0000 mg | ORAL_TABLET | Freq: Every day | ORAL | Status: DC
  Filled 2022-10-15: qty 1

## 2022-10-15 NOTE — ED Notes (Signed)
Pt was sleeping when asked if he would like to take the quetiapine. Pt refused stating he did not need any assistance to sleep.

## 2022-10-15 NOTE — ED Provider Notes (Signed)
Emergency Medicine Observation Re-evaluation Note  Rashard Ryle is a 52 y.o. male, seen on rounds today.  Pt initially presented to the ED for complaints of Medical Clearance Currently, the patient is sleeping.  Physical Exam  BP (!) 137/98 (BP Location: Right Arm)   Pulse 82   Temp 98.2 F (36.8 C) (Oral)   Resp 18   Ht 6' 2.75" (1.899 m)   Wt 98.9 kg   SpO2 91%   BMI 27.43 kg/m  Physical Exam General: Sleeping Cardiac: Regular rate Lungs: No distress Psych: Guarded does not give much information  ED Course / MDM  EKG:   I have reviewed the labs performed to date as well as medications administered while in observation.  Recent changes in the last 24 hours include none.  Plan  Current plan is for psychiatric evaluation for disposition.    Gwyneth Sprout, MD 10/15/22 1201

## 2022-10-15 NOTE — ED Notes (Signed)
Patient alert and oriented this shift. Cooperative with staff.  Patient calm.  Patient guarded and quiet with staff. No suicidal ideation noted. No homicidal ideation noted.  Patient ambulatory and able to complete ADLs.

## 2022-10-15 NOTE — ED Notes (Signed)
Portable EKG device is not working. EKG could not be snapped by NT

## 2022-10-15 NOTE — ED Notes (Signed)
Patient has been sleeping all shift and got up once to use the bathroom.

## 2022-10-15 NOTE — Progress Notes (Signed)
LCSW Progress Note  [id number]   Patrick Deleon  10/15/2022  1:51 PM  Description:   Inpatient Psychiatric Referral  Patient was recommended inpatient per Dahlia Byes, NP.  There are no available beds at [facility], per [facility] Rona Ravens, RN. Patient was referred to the following out of network facilities:    Destination  Service Provider Address Phone Fax  [facility] [address] [place] [address] [phone]  [facility]  [address], [place] [address] [phone]  [facility]  [address] [place] [address] [phone]  [facility]  [address] [place] [address] [phone]  [facility] [address]  [place] [address] [phone]  [facility]  [address] [place] [address] [phone]  [facility]  [address] [place] [address] [phone]  [facility]  [address] [place] [address] [phone]  [facility]  [address], [place] [address] [phone]  [facility]  [address] [place] [address] [phone]  [facility]  [address], [place] [address] [phone]  [facility]  [address], [place] [address] [phone]  [facility]  [address], [place] [address] [phone]  [facility]  [address], [place] [address] [phone]  [facility]  [address] [place] [address] [phone]  [facility]  [address], [place] [address] [phone]  [facility]  [address] [place] [address] [phone]  [facility]  [address] [place] [address] [phone]  [facility]  [address] [place] [address] [phone]  [facility]  [address] [place] [address] [phone]  [facility]  [place] [phone]  [facility]  [address], [place] [address] [phone]  [facility]  [address] [place] [address] [phone]  [facility]  [address] [place] [address] [phone]  [facility]  [facility], [place] [phone]  [facility]  [address] [place] [address] [phone]  [facility]  [place] [phone] --    Situation ongoing, CSW to continue following and update chart as more information becomes available.      [address] [place], [facility] 10/15/2022 1:51 PM

## 2022-10-15 NOTE — Progress Notes (Signed)
[facility] Psych ED Progress Note  10/15/2022 11:57 AM Patrick Deleon  MRN:  [id number]   Subjective:  Patrick Deleon is a 52 y.o. male patient admitted with previous hx of Anxiety back in 2018 .came in to the ER voluntarily appeared to be Manic at the time.  He was accompanied by [employer] called by his wife.  Patient was very anxious and manic on arrival that made him keep talking non stop  He was tearful asking staff to call his wife to find out what he can do to fix their marriage. Patient slept all night per Nursing report after his wife visited briefly due to visiting time ending. This morning patient states nothing is wrong with him.  He also states his Paranoia is justified due to his wife's behavior.  Patient states that his wife does not behave like she want to stay in the Marriage.  He strongly believes that he does not have Bipolar disorder and that his behavior is due to self Medication with Cocaine.  He admits to feeling depressed and anxious due to his wife's behavior and not having a job. This morning provider called Patrick Deleon his spouse at [phone]) who stated that she has been dealing with her husbands who is paranoid and suspicious.  She states that patient is totally fixated on her having affairs, goes to [employer] going to see who she is talking to.  Se reports that patient checks goggle map to check where the wife has been to.  She states husband was fired from a good job due to poor concentration at work.  She suspected patient was using drugs and patient denied all until yesterday the Police came to the house yesterday he admitted using Cocaine.  Patrick Deleon went to the court to IVC him yesterday but the staff at the court told her only Police and Provider can IVC a patient.  She plans to talk to the her husband to come in and get treatment voluntary.  She also states she will leave him if he refuses care.  She reports patient does not sleep and is irritable most of the days and  night. Patient has agreed to to come in  the hospital for treatment with condition that his wife will seek treatment as well. Patient is agreeing to get treatment for depression and not for Bipolar stating he does  not have Bipolar.  Disorder.  Wife walked in to visits and will discuss voluntary treatment with him.  We will seek bed place ment and start Lexapro for depression and anxiety and offer Seroquel at night for mood and sleep.  Principal Problem: Paranoia (psychosis) (HCC) Diagnosis:  Principal Problem:   Paranoia (psychosis) (HCC) Active Problems:   Generalized anxiety disorder   Cocaine-induced mood disorder [facility])   ED Assessment Time Calculation: Start Time: 1124 Stop Time: 1148 Total Time in Minutes (Assessment Completion): 24   Past Psychiatric History: see initial Psychiatry evaluation note  [place] Scale:  Flowsheet Row ED from 10/14/2022 in [facility] Emergency Department at [facility]  Deleon-SSRS RISK CATEGORY No Risk       Past Medical History: No past medical history on file.  Past Surgical History:  Procedure Laterality Date   HERNIA REPAIR     Family History:  Family History  Problem Relation Age of Onset   Cancer Mother    Cancer Maternal Grandmother    Family Psychiatric  History:  see initial Psychiatry evaluation note Social History:  Social  History   Substance and Sexual Activity  Alcohol Use Not Currently     Social History   Substance and Sexual Activity  Drug Use Not Currently    Social History   Socioeconomic History   Marital status: Single    Spouse name: Not on file   Number of children: Not on file   Years of education: Not on file   Highest education level: Not on file  Occupational History   Not on file  Tobacco Use   Smoking status: Every Day    Types: Cigarettes   Smokeless tobacco: Never  Substance and Sexual Activity   Alcohol use: Not Currently   Drug use: Not Currently   Sexual activity: Not on file   Other Topics Concern   Not on file  Social History Narrative   Not on file   Social Determinants of Health   Financial Resource Strain: Not on file  Food Insecurity: Not on file  Transportation Needs: Not on file  Physical Activity: Not on file  Stress: Not on file  Social Connections: Not on file    Sleep: Fair  Appetite:  Fair  Current Medications: Current Facility-Administered Medications  Medication Dose Route Frequency Provider Last Rate Last Admin   escitalopram (LEXAPRO) tablet 5 mg  5 mg Oral Daily Patrick Spelman C, NP       LORazepam (ATIVAN) tablet 1 mg  1 mg Oral Q8H PRN Montina Dorrance C, NP       QUEtiapine (SEROQUEL) tablet 50 mg  50 mg Oral QHS Dahlia Byes C, NP       Current Outpatient Medications  Medication Sig Dispense Refill   ibuprofen (ADVIL,MOTRIN) 200 MG tablet Take 200 mg by mouth every 6 (six) hours as needed.      Lab Results:  Results for orders placed or performed during the hospital encounter of 10/14/22 (from the past 48 hour(s))  Comprehensive metabolic panel     Status: Abnormal   Collection Time: 10/14/22 12:23 PM  Result Value Ref Range   Sodium 139 135 - 145 mmol/L   Potassium 3.7 3.5 - 5.1 mmol/L   Chloride 107 98 - 111 mmol/L   CO2 21 (L) 22 - 32 mmol/L   Glucose, Bld 96 70 - 99 mg/dL    Comment: Glucose reference range applies only to samples taken after fasting for at least 8 hours.   BUN 13 6 - 20 mg/dL   Creatinine, Ser 6.21 0.61 - 1.24 mg/dL   Calcium 9.3 8.9 - 30.8 mg/dL   Total Protein 7.9 6.5 - 8.1 g/dL   Albumin 4.4 3.5 - 5.0 g/dL   AST 25 15 - 41 U/L   ALT 30 0 - 44 U/L   Alkaline Phosphatase 69 38 - 126 U/L   Total Bilirubin 1.5 (H) 0.3 - 1.2 mg/dL   GFR, Estimated >65 >78 mL/min    Comment: (NOTE) Calculated using the CKD-EPI Creatinine Equation (2021)    Anion gap 11 5 - 15    Comment: Performed at [facility], [address], [place], [place] [address]  Ethanol     Status:  None   Collection Time: 10/14/22 12:23 PM  Result Value Ref Range   Alcohol, Ethyl (B) <10 <10 mg/dL    Comment: (NOTE) Lowest detectable limit for serum alcohol is 10 mg/dL.  For medical purposes only. Performed at [facility], [address], [place], [place] [address]   Salicylate level  Status: Abnormal   Collection Time: 10/14/22 12:23 PM  Result Value Ref Range   Salicylate Lvl <7.0 (L) 7.0 - 30.0 mg/dL    Comment: Performed at [facility], [address], [place], [place] [address]  Acetaminophen level     Status: Abnormal   Collection Time: 10/14/22 12:23 PM  Result Value Ref Range   Acetaminophen (Tylenol), Serum <10 (L) 10 - 30 ug/mL    Comment: (NOTE) Therapeutic concentrations vary significantly. A range of 10-30 ug/mL  may be an effective concentration for many patients. However, some  are best treated at concentrations outside of this range. Acetaminophen concentrations >150 ug/mL at 4 hours after ingestion  and >50 ug/mL at 12 hours after ingestion are often associated with  toxic reactions.  Performed at [facility], [address] [place], [place] [address]   cbc     Status: Abnormal   Collection Time: 10/14/22 12:23 PM  Result Value Ref Range   WBC 12.1 (H) 4.0 - 10.5 K/uL   RBC 4.86 4.22 - 5.81 MIL/uL   Hemoglobin 14.7 13.0 - 17.0 g/dL   HCT 91.4 78.2 - 95.6 %   MCV 92.6 80.0 - 100.0 fL   MCH 30.2 26.0 - 34.0 pg   MCHC 32.7 30.0 - 36.0 g/dL   RDW 21.3 08.6 - 57.8 %   Platelets 301 150 - 400 K/uL   nRBC 0.0 0.0 - 0.2 %    Comment: Performed at [facility], [address], [place], [place] [address]  Rapid urine drug screen (hospital performed)     Status: Abnormal   Collection Time: 10/14/22 12:23 PM  Result Value Ref Range   Opiates NONE DETECTED NONE DETECTED   Cocaine POSITIVE (A) NONE DETECTED   Benzodiazepines NONE DETECTED NONE DETECTED   Amphetamines NONE  DETECTED NONE DETECTED   Tetrahydrocannabinol NONE DETECTED NONE DETECTED   Barbiturates NONE DETECTED NONE DETECTED    Comment: (NOTE) DRUG SCREEN FOR MEDICAL PURPOSES ONLY.  IF CONFIRMATION IS NEEDED FOR ANY PURPOSE, NOTIFY LAB WITHIN 5 DAYS.  LOWEST DETECTABLE LIMITS FOR URINE DRUG SCREEN Drug Class                     Cutoff (ng/mL) Amphetamine and metabolites    1000 Barbiturate and metabolites    200 Benzodiazepine                 200 Opiates and metabolites        300 Cocaine and metabolites        300 THC                            50 Performed at [facility], [address], [place], [place] [address]     Blood Alcohol level:  Lab Results  Component Value Date   ETH <10 10/14/2022    Physical Findings:  CIWA:    COWS:     Musculoskeletal: Strength & Muscle Tone: within normal limits Gait & Station: normal Patient leans: Front  Psychiatric Specialty Exam:  Presentation  General Appearance:  Casual; Neat  Eye Contact: Good  Speech: Clear and Coherent; Normal Rate  Speech Volume: Normal  Handedness: Right   Mood and Affect  Mood: Anxious; Depressed  Affect: Congruent; Depressed   Thought Process  Thought Processes: Coherent; Goal Directed  Descriptions of Associations:Intact  Orientation:Full (Time, Place and Person)  Thought  Content:Logical  History of Schizophrenia/Schizoaffective disorder:No data recorded Duration of Psychotic Symptoms:No data recorded Hallucinations:Hallucinations: None  Ideas of Reference:Paranoia; Delusions  Suicidal Thoughts:Suicidal Thoughts: No  Homicidal Thoughts:Homicidal Thoughts: No   Sensorium  Memory: Immediate Fair; Recent Good; Remote Good  Judgment: Impaired  Insight: Lacking   Executive Functions  Concentration: Fair  Attention Span: Fair  Recall: Fair  Fund of Knowledge: Fair  Language: Good   Psychomotor Activity  Psychomotor  Activity: Psychomotor Activity: Normal   Assets  Assets: Manufacturing systems engineer; Housing; Intimacy; Physical Health   Sleep  Sleep: Sleep: Fair    Physical Exam: Physical Exam Vitals and nursing note reviewed.  HENT:     Head: Normocephalic.     Nose: Nose normal.  Cardiovascular:     Rate and Rhythm: Normal rate and regular rhythm.  Pulmonary:     Effort: Pulmonary effort is normal.  Musculoskeletal:        General: Normal range of motion.     Cervical back: Normal range of motion.  Skin:    General: Skin is dry.  Neurological:     Mental Status: He is alert and oriented to person, place, and time.  Psychiatric:        Attention and Perception: Perception normal. He is inattentive.        Mood and Affect: Mood is anxious and depressed. Affect is angry.        Speech: He is noncommunicative. Speech is rapid and pressured.        Behavior: Behavior normal. Behavior is cooperative.        Thought Content: Thought content normal.        Cognition and Memory: Cognition and memory normal.        Judgment: Judgment normal.    Review of Systems  Constitutional: Negative.   HENT: Negative.    Eyes: Negative.   Respiratory: Negative.    Cardiovascular: Negative.   Gastrointestinal: Negative.   Genitourinary: Negative.   Musculoskeletal: Negative.   Skin: Negative.   Neurological: Negative.   Endo/Heme/Allergies: Negative.   Psychiatric/Behavioral:  Positive for depression and substance abuse. The patient is nervous/anxious and has insomnia.    Blood pressure (!) 137/98, pulse 82, temperature 98.2 F (36.8 Deleon), temperature source Oral, resp. rate 18, height 6' 2.75" (1.899 m), weight 98.9 kg, SpO2 91%. Body mass index is 27.43 kg/m.   Medical Decision Making: This morning after speaking with patient's wife and speaking with patient who agrees to come in for treatment of Depression, anxiety and insomnia.  He now agrees that he has been using Cocaine to  self medicate  self.  He denies SI/HI/AVH.    We will seek bed placement and fax out records to facilities with available bed.  Problem 1: Paranoia Psychosis   Problem 2: Cocaine Induced  mood disorder  Problem 3: Generalized anxiety disorder.  Earney Navy, NP-PMHNP-BC                                                   10/15/2022, 11:57 AM

## 2022-10-16 MED ORDER — ESCITALOPRAM OXALATE 5 MG PO TABS: 5.0000 mg | ORAL_TABLET | Freq: Every day | ORAL | 0 refills | Status: AC

## 2022-10-16 MED ORDER — QUETIAPINE FUMARATE 50 MG PO TABS: 50.0000 mg | ORAL_TABLET | Freq: Every day | ORAL | 0 refills | Status: AC

## 2022-10-16 NOTE — ED Notes (Signed)
Patient has been alert and oriented this shift.  Appropriate with staff. Patient declined to take prescribed medication this AM.   Patient quiet.  Guarded. No delusion or paranoia noted.  Patient speech normal rate and rhythm.

## 2022-10-16 NOTE — ED Notes (Signed)
Patient discharged off unit to home per provider. Patient alert, cooperative and no s/s of distress at this time. Patient discharge information given to the patient.  Belongings given to patient.  Patient ambulated off the unit, escorted by NT. Patient transported by family

## 2022-10-16 NOTE — ED Provider Notes (Signed)
Emergency Medicine Observation Re-evaluation Note  Patrick Deleon is a 52 y.o. male, seen on rounds today.  Pt initially presented to the ED for complaints of Medical Clearance Currently, the patient is no acute distress.  Physical Exam  BP 127/84   Pulse 78   Temp 98 F (36.7 C) (Oral)   Resp 18   Ht 1.899 m (6' 2.75")   Wt 98.9 kg   SpO2 98%   BMI 27.43 kg/m  Physical Exam   ED Course / MDM  EKG:   I have reviewed the labs performed to date as well as medications administered while in observation.  Recent changes in the last 24 hours include none.  Plan  Current plan is for placement.    Lorre Nick, MD 10/16/22 [phone]

## 2022-10-16 NOTE — Discharge Instructions (Addendum)
We strongly recommend you begin taking the seroquel medicine at night to help stabilize your mood and help you sleep.  Avoid alcohol or drugs like cocaine which can TRIGGER mental health crises.

## 2022-10-16 NOTE — Progress Notes (Signed)
LCSW Progress Note  [id number]   Patrick Deleon  10/16/2022  2:35 PM  Description:   Inpatient Psychiatric Referral  Patient was recommended inpatient per Dahlia Byes, NP. There are no available beds at [facility], per [facility] Rona Ravens, RN. Patient was referred to the following out of network facilities:   [facility] Provider Address Phone Fax  [facility]  [address] [place] [address] [phone]  [facility]  [address] [place] [address] [phone]  [facility]  [address] [place] [address] [phone]  [facility]  [address], [place] [address] [phone]  [facility]  [address] [place] [address] [phone]  [facility]  [address] [place] [address] [phone]  [facility]  [address] [place] [address] [phone]  [facility]  [address], [place] [address] [phone]  [facility]  [address] [place] [address] [phone]  [facility]  [address], [place] [address] [phone]  [facility]  [place] [phone]  [facility] [address]  [address] [place] [address] [phone]  [facility]  [address] [place] [address] [phone]  [facility]  [address] [place], [place] [address] [phone]  [facility]  [address] [place], [place] [address] [phone]  [facility]  [address], [place] [address] [phone]  [facility]  [address] [place], [place] [phone]  [facility]  [address] [place], [place] [address] [phone]  [facility]  [address] [place] [address] [phone]  [facility] In[facility] EFAX  [place] [phone]    Situation ongoing, CSW to continue following and update chart as more information becomes available.      Cathie Beams, [place]  10/16/2022 2:35 PM

## 2022-10-16 NOTE — ED Provider Notes (Signed)
Patient cleared by psychiatric services to pursue outpatient treatment.  Patient's wife in agreement per my discussion with psych NP.  Patient is stable for discharge at this time.     Terald Sleeper, MD 10/16/22 [phone]

## 2022-10-16 NOTE — Discharge Summary (Signed)
[facility] Psych ED Discharge  10/16/2022 3:56 PM Add Dinapoli  MRN:  [id number]  Principal Problem: Paranoia (psychosis) [facility]) Discharge Diagnoses: Principal Problem:   Paranoia (psychosis) (HCC) Active Problems:   Generalized anxiety disorder   Cocaine-induced mood disorder (HCC)  Clinical Impression:  Final diagnoses:  Mania (HCC)  Cocaine abuse (HCC)   Subjective: Patrick Deleon is a 52 y.o. male patient admitted with previous hx of Anxiety back in 2018 .came in to the ER voluntarily appeared to be Manic at the time.  He was accompanied by [employer] called by his wife.  Patient was very anxious and manic on arrival that made him keep talking non stop  He was tearful asking staff to call his wife to find out what he can do to fix their marriage.  Patient has calmed down much after visits from his wife and planning to engage in couple therapy.  Patient is not accepting Medications and still believes he does not need one.  He plans to start therapy first.  Both patient and wife has declined admission since we have no bed available.  However patient vehemently denies SI/HI/AVH. Patient states he will work with the wife to seeking counseling and if needed will engage in Mental health care.  He is alert and oriented x5, he is calm, speech is clear, normal rate and tone.  Patient  and provider reviewed safety plan-call 911 or [phone] for mental health crisis including but not limited to Suicide ideation or thought.  Patient is also given resources for [facility] Mental facility for all Mental health care.  Patient is advised to engage in outpatient Substance abuse care.  Patient is Psychiatrically clear. ED Assessment Time Calculation: Start Time: 1531 Stop Time: 1554 Total Time in Minutes (Assessment Completion): 23   Past Psychiatric History: see initial Psychiatry evaluation note  Past Medical History: No past medical history on file.  Past Surgical History:  Procedure Laterality Date   HERNIA REPAIR      Family History:  Family History  Problem Relation Age of Onset   Cancer Mother    Cancer Maternal Grandmother    Family Psychiatric  History: see initial Psychiatry evaluation note  Social History:  Social History   Substance and Sexual Activity  Alcohol Use Not Currently     Social History   Substance and Sexual Activity  Drug Use Not Currently    Social History   Socioeconomic History   Marital status: Single    Spouse name: Not on file   Number of children: Not on file   Years of education: Not on file   Highest education level: Not on file  Occupational History   Not on file  Tobacco Use   Smoking status: Every Day    Types: Cigarettes   Smokeless tobacco: Never  Substance and Sexual Activity   Alcohol use: Not Currently   Drug use: Not Currently   Sexual activity: Not on file  Other Topics Concern   Not on file  Social History Narrative   Not on file   Social Determinants of Health   Financial Resource Strain: Not on file  Food Insecurity: Not on file  Transportation Needs: Not on file  Physical Activity: Not on file  Stress: Not on file  Social Connections: Not on file    Tobacco Cessation:  N/A, patient does not currently use tobacco products  Current Medications: Current Facility-Administered Medications  Medication Dose Route Frequency Provider Last Rate Last Admin   escitalopram (LEXAPRO)  tablet 5 mg  5 mg Oral Daily Orvan Papadakis C, NP   5 mg at 10/15/22 1438   LORazepam (ATIVAN) tablet 1 mg  1 mg Oral Q8H PRN Dahlia Byes C, NP       QUEtiapine (SEROQUEL) tablet 50 mg  50 mg Oral QHS Dahlia Byes C, NP       Current Outpatient Medications  Medication Sig Dispense Refill   ibuprofen (ADVIL,MOTRIN) 200 MG tablet Take 200 mg by mouth every 6 (six) hours as needed.     [START ON 10/17/2022] escitalopram (LEXAPRO) 5 MG tablet Take 1 tablet (5 mg total) by mouth daily. 30 tablet 0   QUEtiapine (SEROQUEL) 50 MG tablet Take 1  tablet (50 mg total) by mouth at bedtime. 30 tablet 0   PTA Medications: (Not in a hospital admission)   [place] Scale:  Flowsheet Row ED from 10/14/2022 in [facility] Emergency Department at [facility]  C-SSRS RISK CATEGORY No Risk       Musculoskeletal: Strength & Muscle Tone: within normal limits Gait & Station: normal Patient leans: Front  Psychiatric Specialty Exam: Presentation  General Appearance:  Casual; Neat  Eye Contact: Good  Speech: Clear and Coherent; Normal Rate  Speech Volume: Normal  Handedness: Right   Mood and Affect  Mood: Anxious  Affect: Congruent   Thought Process  Thought Processes: Coherent; Goal Directed  Descriptions of Associations:Intact  Orientation:Full (Time, Place and Person)  Thought Content:Logical  History of Schizophrenia/Schizoaffective disorder:No data recorded Duration of Psychotic Symptoms:No data recorded Hallucinations:Hallucinations: None  Ideas of Reference:Paranoia  Suicidal Thoughts:Suicidal Thoughts: No  Homicidal Thoughts:Homicidal Thoughts: No   Sensorium  Memory: Immediate Good; Recent Good; Remote Good  Judgment: Good  Insight: Good   Executive Functions  Concentration: Good  Attention Span: Good  Recall: Good  Fund of Knowledge: Good  Language: Good   Psychomotor Activity  Psychomotor Activity: Psychomotor Activity: Normal   Assets  Assets: Communication Skills; Desire for Improvement; Housing; Intimacy   Sleep  Sleep: Sleep: Good    Physical Exam: Physical Exam Vitals and nursing note reviewed.  Constitutional:      Appearance: Normal appearance.  HENT:     Head: Normocephalic and atraumatic.     Nose: Nose normal.  Cardiovascular:     Rate and Rhythm: Normal rate and regular rhythm.  Pulmonary:     Effort: Pulmonary effort is normal.  Musculoskeletal:        General: Normal range of motion.     Cervical back: Normal range of  motion.  Skin:    General: Skin is dry.  Neurological:     Mental Status: He is alert and oriented to person, place, and time.  Psychiatric:        Attention and Perception: Attention and perception normal.        Mood and Affect: Mood is anxious.        Speech: Speech normal.        Behavior: Behavior normal. Behavior is cooperative.        Thought Content: Thought content normal.        Cognition and Memory: Cognition and memory normal.        Judgment: Judgment normal.    Review of Systems  Constitutional: Negative.   HENT: Negative.    Eyes: Negative.   Respiratory: Negative.    Cardiovascular: Negative.   Gastrointestinal: Negative.   Genitourinary: Negative.   Musculoskeletal: Negative.   Skin: Negative.   Neurological: Negative.  Endo/Heme/Allergies: Negative.   Psychiatric/Behavioral:  Positive for substance abuse. The patient is nervous/anxious.    Blood pressure (!) 141/83, pulse 79, temperature 97.7 F (36.5 C), temperature source Oral, resp. rate 17, height 6' 2.75" (1.899 m), weight 98.9 kg, SpO2 100%. Body mass index is 27.43 kg/m.   Demographic Factors:  Male, Adolescent or young adult, Caucasian, and Unemployed  Loss Factors: NA  Historical Factors: Family history of mental illness or substance abuse and Impulsivity  Risk Reduction Factors:   Responsible for children under 37 years of age, Sense of responsibility to family, Living with another person, especially a relative, and Positive therapeutic relationship  Continued Clinical Symptoms:  Alcohol/Substance Abuse/Dependencies  Cognitive Features That Contribute To Risk:  None    Suicide Risk:  Minimal: No identifiable suicidal ideation.  Patients presenting with no risk factors but with morbid ruminations; may be classified as minimal risk based on the severity of the depressive symptoms    Plan Of Care/Follow-up recommendations:  Activity:  as tolerated Diet:  Regular  Medical Decision  Making: Patient denies SI/HI/AVH.  Patient declined admission at this time but plans to engage in outpatient Mental healthcare.  Patient is discharged with Seroquel for mood and sleep and Lexapro for anxiety and depression.  Patient is Psychiatrically cleared.  Disposition: Psychiatrically cleared. Earney Navy, NP-PMHNP-BC 10/16/2022, 3:56 PM
